# Patient Record
Sex: Male | Born: 1940 | Race: White | Hispanic: No | Marital: Married | Smoking: Former smoker
Health system: Southern US, Community
[De-identification: ages and names within clinical notes are randomized; demographics above are authoritative.]

## PROBLEM LIST (undated history)

## (undated) DIAGNOSIS — D689 Coagulation defect, unspecified: Secondary | ICD-10-CM

## (undated) DIAGNOSIS — H269 Unspecified cataract: Secondary | ICD-10-CM

## (undated) DIAGNOSIS — I639 Cerebral infarction, unspecified: Secondary | ICD-10-CM

## (undated) DIAGNOSIS — I10 Essential (primary) hypertension: Secondary | ICD-10-CM

## (undated) DIAGNOSIS — E119 Type 2 diabetes mellitus without complications: Secondary | ICD-10-CM

## (undated) HISTORY — DX: Cerebral infarction, unspecified: I63.9

## (undated) HISTORY — DX: Unspecified cataract: H26.9

## (undated) HISTORY — DX: Coagulation defect, unspecified: D68.9

## (undated) HISTORY — PX: CATARACT EXTRACTION: SUR2

## (undated) HISTORY — DX: Type 2 diabetes mellitus without complications: E11.9

## (undated) HISTORY — PX: C-EYE SURGERY PROCEDURE: 102257504

## (undated) HISTORY — DX: Essential (primary) hypertension: I10

## (undated) HISTORY — PX: EYE SURGERY: SHX253

---

## 1998-04-27 ENCOUNTER — Encounter: Payer: Self-pay | Admitting: Cardiology

## 1998-04-27 ENCOUNTER — Ambulatory Visit (HOSPITAL_COMMUNITY): Admission: RE | Admit: 1998-04-27 | Discharge: 1998-04-27 | Payer: Self-pay | Admitting: Cardiology

## 2000-10-09 ENCOUNTER — Encounter: Admission: RE | Admit: 2000-10-09 | Discharge: 2001-01-07 | Payer: Self-pay | Admitting: *Deleted

## 2000-11-07 ENCOUNTER — Encounter: Payer: Self-pay | Admitting: *Deleted

## 2000-11-07 ENCOUNTER — Encounter: Admission: RE | Admit: 2000-11-07 | Discharge: 2000-11-07 | Payer: Self-pay | Admitting: *Deleted

## 2001-02-21 ENCOUNTER — Ambulatory Visit (HOSPITAL_COMMUNITY): Admission: RE | Admit: 2001-02-21 | Discharge: 2001-02-21 | Payer: Self-pay | Admitting: Gastroenterology

## 2002-04-02 ENCOUNTER — Encounter: Payer: Self-pay | Admitting: Internal Medicine

## 2002-04-02 ENCOUNTER — Inpatient Hospital Stay (HOSPITAL_COMMUNITY): Admission: EM | Admit: 2002-04-02 | Discharge: 2002-04-05 | Payer: Self-pay | Admitting: Emergency Medicine

## 2002-05-01 ENCOUNTER — Encounter: Admission: RE | Admit: 2002-05-01 | Discharge: 2002-07-30 | Payer: Self-pay | Admitting: *Deleted

## 2003-07-02 ENCOUNTER — Ambulatory Visit (HOSPITAL_BASED_OUTPATIENT_CLINIC_OR_DEPARTMENT_OTHER): Admission: RE | Admit: 2003-07-02 | Discharge: 2003-07-02 | Payer: Self-pay | Admitting: Family Medicine

## 2006-08-10 ENCOUNTER — Emergency Department (HOSPITAL_COMMUNITY): Admission: EM | Admit: 2006-08-10 | Discharge: 2006-08-10 | Payer: Self-pay | Admitting: Emergency Medicine

## 2006-08-14 ENCOUNTER — Emergency Department (HOSPITAL_COMMUNITY): Admission: EM | Admit: 2006-08-14 | Discharge: 2006-08-14 | Payer: Self-pay | Admitting: Emergency Medicine

## 2007-11-29 ENCOUNTER — Ambulatory Visit (HOSPITAL_COMMUNITY): Admission: RE | Admit: 2007-11-29 | Discharge: 2007-11-29 | Payer: Self-pay | Admitting: Internal Medicine

## 2010-02-16 IMAGING — CR DG CHEST 2V
2 series · 2 of 2 positions shown · non-contrast
Comparison: 08/10/2006

CLINICAL DATA: Hypertension.  History of angioplasty and history of
smoking.

CHEST - 2 VIEW

[view not recorded (1 of 2)]
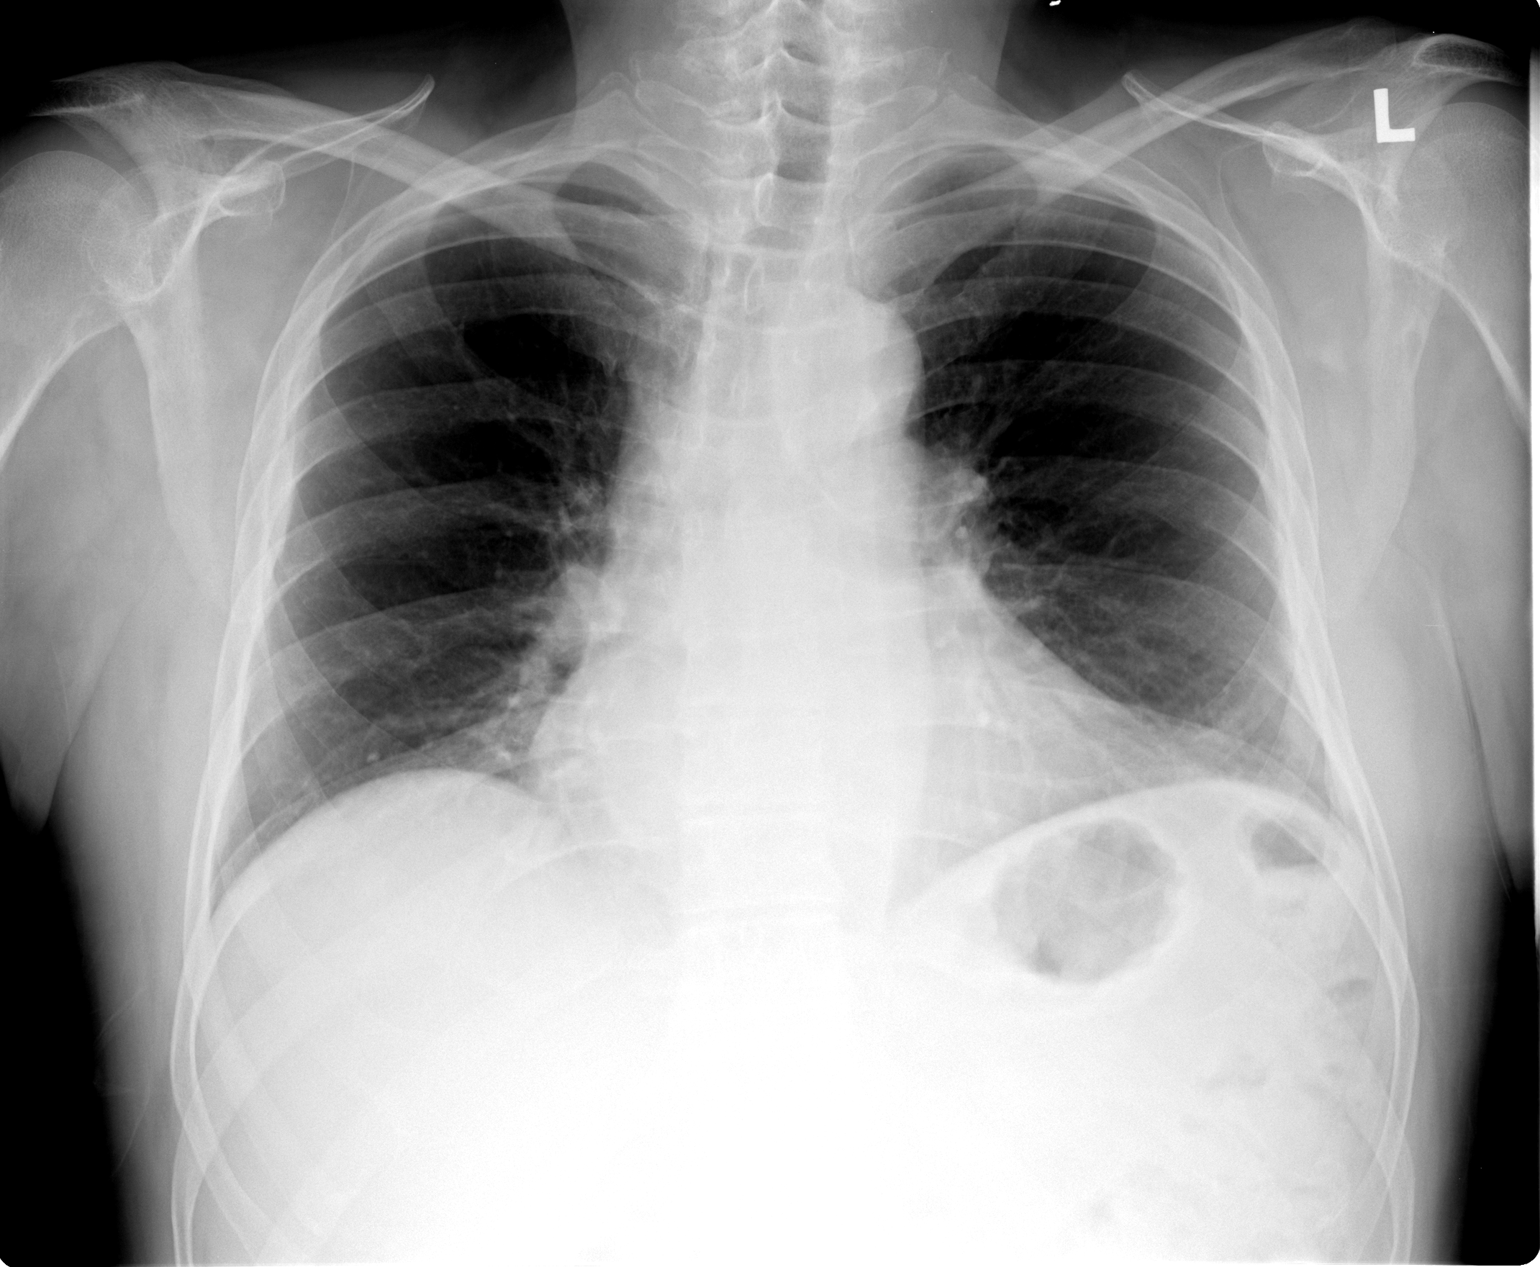

[view not recorded (2 of 2)]
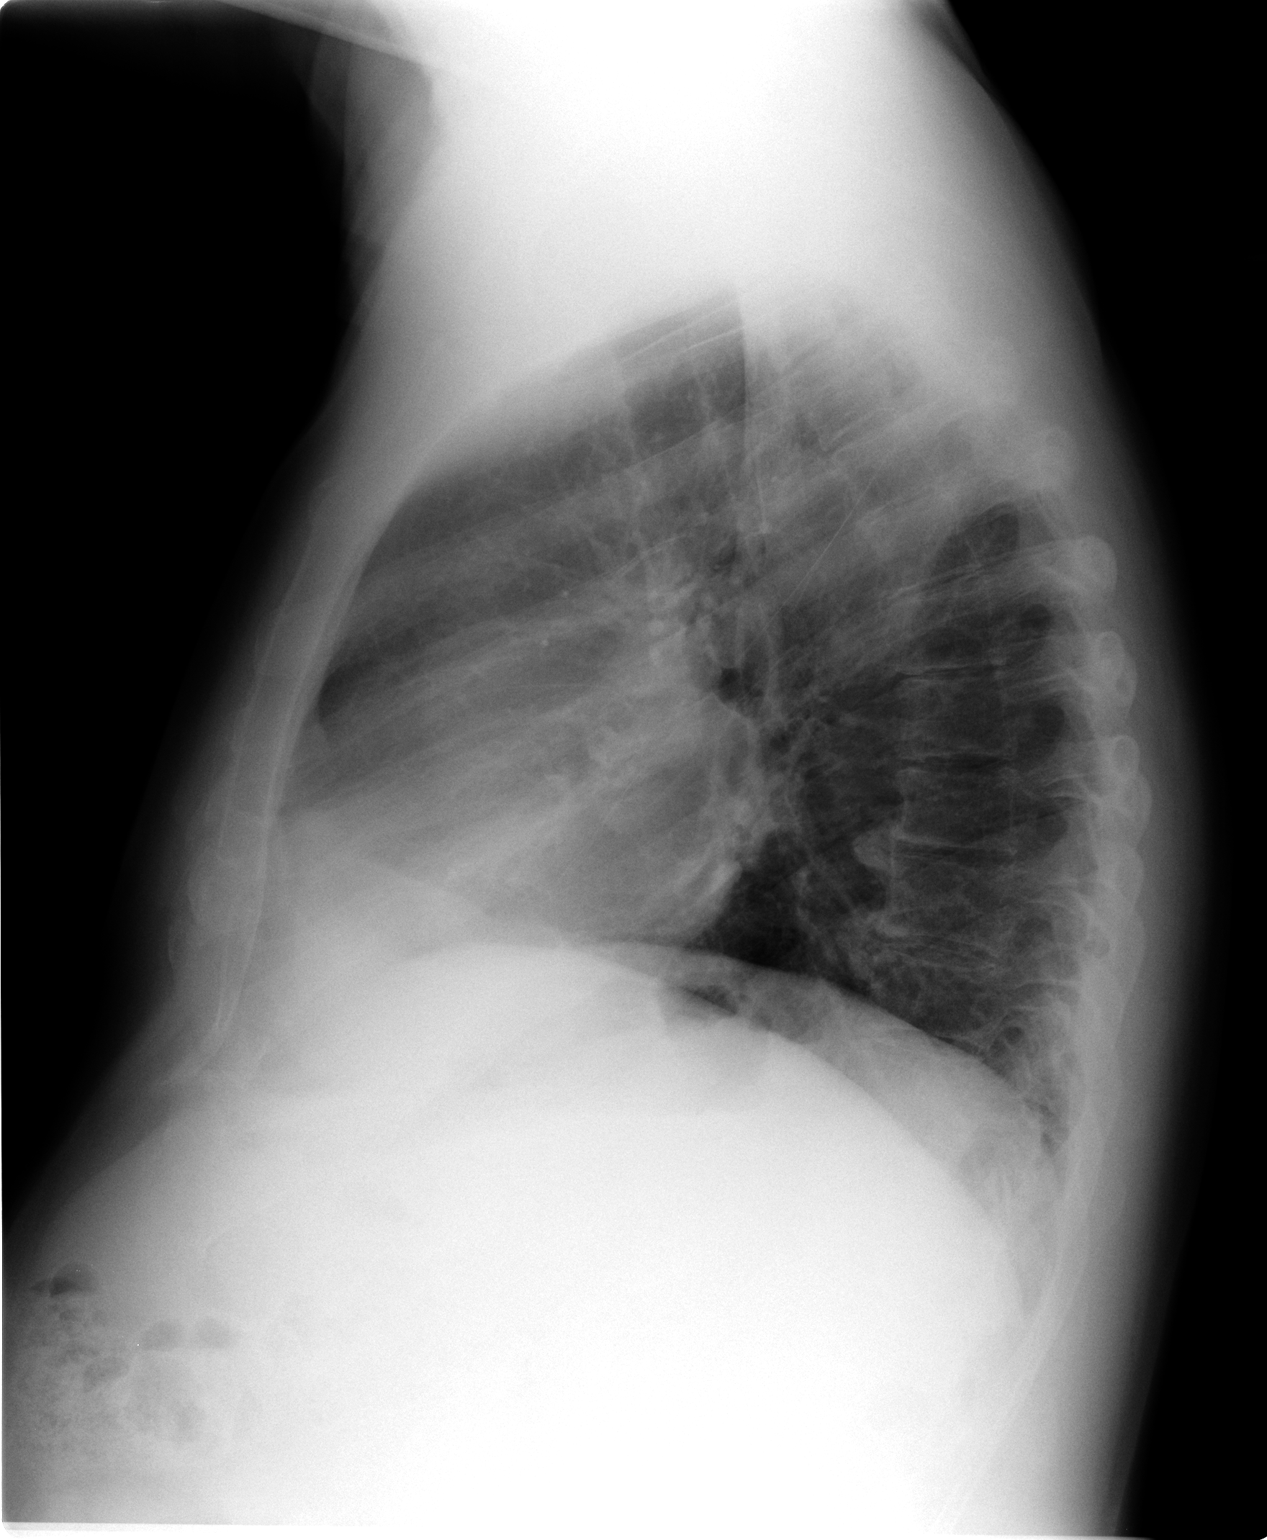

[2 of 2 positions shown; findings below may reference images not displayed]

FINDINGS: A poor inspiratory effort has been made and taking this
into consideration heart and mediastinal contours are within normal
limits.  The lung fields appear clear with the exception of some
minimal bibasilar volume loss compatible with the poor inspiration.
Bony structures demonstrate degenerative osteophytosis of the lower
lumbar spine and are otherwise intact.
IMPRESSION: Poor inspiration with no acute cardiopulmonary disease noted.

## 2010-06-04 NOTE — H&P (Signed)
Pedro Morgan, Pedro Morgan                              ACCOUNT NO.:  [id number]   MEDICAL RECORD NO.:  [id number]                   PATIENT TYPE:  EMS   LOCATION:  [facility]                                 FACILITY:  [facility]   PHYSICIAN:  Rosanne Sack, M.D.         DATE OF BIRTH:  [birth year]   DATE OF ADMISSION:  04/02/2002  DATE OF DISCHARGE:                                HISTORY & PHYSICAL   PROBLEM LIST:  1. Sign and symptom complex; fever to 101.9, right upper quadrant pain,     diarrhea, decreased level of consciousness.     a. Rule out acute cholecystitis with septicemia versus other etiologies.  2. Dehydration.  3. Uncontrolled diabetes mellitus, type 2.  4. Uncontrolled hypertension.  5. Coronary artery disease.     a. Percutaneous transluminal coronary angioplasty x2 in 1994 and 1999 by        Dr. Donnie Morgan.  6. Hyperlipidemia.  7. Hiatal hernia.  8. Internal hemorrhoids by colonoscopy on 2003.     a. Occasional rectal bleed.  9. Allergies to PENICILLIN (anaphylaxis).   CHIEF COMPLAINT:  Abdominal pain.   HISTORY OF PRESENT ILLNESS:  Pedro Morgan is a very pleasant 70 year old  gentleman sent from Pedro Morgan office this afternoon with complaints  of abdominal pain.  The patient described some diarrhea two days ago that  was fully benign.  The symptoms were pretty much resolved.  This morning the  patient woke up with a sharp-type pain in the right upper quadrant radiating  to the interscapular space.  These symptoms were intermittent.  He describes  also nausea, though no vomiting.  He also had worsening episodes of diarrhea  this morning.  There was also reported a decreased level of consciousness by  the patient's wife along with possible fever and chills.  No cough and no  chest pain.  No orthopnea.  There were occasional signs of shortness of  breath with the onset of sharp pain.  No hemoptysis or hematemesis.  No  melena, tarry stools or bright-red blood per  rectum over the last two days.  The patient had occasional rectal bleed associated with internal hemorrhoids  diagnosed by Pedro Morgan during a colonoscopy about two years ago.  No skin  rash.  No lower extremity swelling.  No focal weakness.  No syncope.  No  urinary symptoms.  No lower back pain.  No blurred vision.  No swallowing  problems.   PAST MEDICAL HISTORY:  As per problem list.   ALLERGIES:  PENICILLIN (anaphylaxis).   MEDICATIONS:  The patient was not taking consistently the following  medications:  1. Zocor 40 mg p.o. daily.  2. Atenolol 100 mg p.o. daily.  3. Lotrel 10/20 one tablet p.o. daily.  4. Maxzide 37.5/25 one tablet p.o. daily.   SOCIAL HISTORY:  The patient is married and has two children.  He is  retired.  He quit  smoking about 30 years ago.  He drank alcohol  occasionally.   FAMILY HISTORY:  The patient's brother, sisters, parents and grandparents  had hypertension.  No diabetes, stroke, coronary artery disease, or  malignancies in the family.   REVIEW OF SYSTEMS:  As per HPI.   PHYSICAL EXAMINATION:  VITAL SIGNS:  Temperature 101.9, blood pressure  180/99, heart rate 132, respirations 24, oxygen saturation 93% on room air.  HEENT:  Normocephalic, atraumatic.  Non icteric sclerae.  Conjunctivae  within normal limits.  PERRLA.  EOMI.  Funduscopic exam shows no papilledema  hemorrhages.  Tympanic membranes within normal limits.  Dry mucous  membranes.  Oropharynx is clear.  NECK:  Supple, no JVD, no bruits, adenopathy, or thyromegaly.  LUNGS:  Clear to auscultation bilaterally without crackles or wheezes, fair  air movement bilaterally.  CARDIAC:  Tachycardia, no S3, no murmurs or rubs.  No definitive gallops.  ABDOMEN:  Obese, mild tenderness, mostly in the right upper quadrant and  epigastric area.  No definitive Murphy sign.  Bowel sounds are decreased.  No hepatosplenomegaly.  No rebound or guarding.  No bruits or masses.  GU:  Within normal  limits.  RECTAL:  Normal sphincter tone, empty vault, prostate size is slightly  increased.  No stool in the vault.  EXTREMITIES:  Trace to 0 edema bilaterally around the ankles.  No cyanosis  or clubbing.  Pulses 1+ bilaterally.  NEUROLOGIC:  Slightly lethargic but arousable to verbal commands.  Strength  is 5/5 in all extremities.  DTRs 3/5 in all extremities.  Cranial nerves II-  XII intact.  Sensorium intact.  Plantar reflexes downgoing bilaterally.   LABORATORY DATA:  Pending.  EKG shows sinus tachycardia without evidence of  ST segment changes or Q waves.   ASSESSMENT AND PLAN:  1. Sign and symptom complex (as described in problem list).  The     differential diagnosis includes acute cholecystitis and cholelithiasis     with septicemia, peptic ulcer disease, rule out mild perforation, also     acute pancreatitis.  Other etiologies that seem to be less likely are     atypical angina, pulmonary embolus or dissection.  Our plan is to admit     the patient to a stepdown unit.  Check blood cultures.  CBC with     differential, CMET, CT scan of the abdomen, lipase, cardiac enzymes are     pending.  The abdominal ultrasound will be obtained and if positive for     cholecystitis, Dr. Daphine Deutscher, on call for surgery, will see the patient.  We     will start empiric Ceclor and Flagyl since the patient is allergic to     penicillin.  The patient will be NPO.  2. Dehydration.  The patient had diarrhea and fever, which seems to be the     basis for his dehydration.  The physical examination is consistent with a     moderate degree of dehydration.  Lab data pending.  We will start IV     fluids and follow the fluid balance as well as electrolytes.  3. Uncontrolled diabetes mellitus type 2.  The patient has not taken any     medications nor done any Accu-Cheks for more than two years.  The patient     was not following a diabetic diet.  His CBG is 328.  The rest of the lab    data is pending.  We  will start Lantus and sliding scale insulin  q.6h.     We will use D5 in the IV fluids as soon as the CBG start to come down.  4. Uncontrolled hypertension.  The patient's blood pressure is 180/99.     There are no signs of end organ damage.  The heart rate is 132.  For now,     we will start Cardizem drip to try to help the blood pressure and heart     rate.  5.     Coronary artery disease.  There is no history of angina.  The enzymes are     pending.  The EKG is negative.  Further therapy to protect the patient's     heart will be chosen based upon the results of the lab data that     currently is pending.                                               Rosanne Sack, M.D.    JM/MEDQ  D:  04/02/2002  T:  04/02/2002  Job:  [id number]   cc:   Christella Noa, M.D.  [address] [place]  [place], [place] [address]  Fax: [phone]   Talmadge Coventry, M.D.  [address], [address]  [place]  [place] [address]  Fax: [phone]

## 2010-06-04 NOTE — Procedures (Signed)
[facility]  Patient:    Pedro Morgan, Pedro Morgan Visit Number: [id number] MRN: [id number]          Service Type: Attending:  Fayrene Fearing L. Randa Evens, M.D. Dictated by:   Llana Aliment. Randa Evens, M.D. Proc. Date: 02/21/01   CC:         Heather Roberts, M.D.   Procedure Report  PROCEDURE:  Colonoscopy.  MEDICATIONS:  Fentanyl 100 mcg, Versed 8 mg IV.  SCOPE:  Olympus adult video colonoscope.  INDICATIONS FOR PROCEDURE:  Heme positive stool.  DESCRIPTION OF PROCEDURE:  The procedure had been explained to the patient and consent obtained. With the patient in the left lateral decubitus position, the Olympus video colonoscope was inserted and advanced under direct visualization. The prep was excellent. We were able to reach the cecum without difficulty. The scope was withdrawn and the cecum, ascending colon, hepatic flexure, transverse colon, splenic flexure, descending and sigmoid colon were seen well upon removal. No polyps were seen throughout the entire colon. The scope was withdrawn down to the rectum. The rectum was also free of polyps. The patient did have moderate internal hemorrhoids. The scope was withdrawn. The patient tolerated the procedure well and was maintained on low flow oxygen and pulse oximeter throughout the procedure.  ASSESSMENT:  Heme positive stool with the only significant finding on colonoscopy internal hemorrhoids.  PLAN:  Will give a sheet of instructions about hemorrhoids and will plan on seeing back in the office in six weeks to recheck his stool. Dictated by:   Llana Aliment. Randa Evens, M.D. Attending:  Llana Aliment. Randa Evens, M.D. DD:  02/21/01 TD:  02/21/01 Job: [id number]

## 2010-06-04 NOTE — Discharge Summary (Signed)
Pedro Morgan, Pedro Morgan                              ACCOUNT NO.:  [id number]   MEDICAL RECORD NO.:  [id number]                   PATIENT TYPE:  INP   LOCATION:  4733                                 FACILITY:  [facility]   PHYSICIAN:  Lonia Blood, M.D.            DATE OF BIRTH:  December 10, 1940   DATE OF ADMISSION:  04/02/2002  DATE OF DISCHARGE:  04/05/2002                                 DISCHARGE SUMMARY   DISCHARGE DIAGNOSIS:  1. Sign and symptom complex, unclear etiology.     a. Fever to 102.     b. Right upper quadrant abdominal pain.     c. Diarrhea.     d. Moderately decreased loss of consciousness.  2. Acute dehydration - resolved.  3. Uncontrolled diabetes mellitus type 2.  4. Uncontrolled hypertension.  5. Coronary artery disease status post Percutaneous transluminal coronary     angioplasty x 2 in 1994 and 1999.  6. Hyperlipidemia.  7. Known hiatal hernia.  8. Internal hemorrhoids by colonoscopy, February 2003.  9. ALLERGY TO PENICILLIN, LEADING TO ANAPHYLAXIS.   DISCHARGE MEDICATIONS:  The patient is discharged home with no change in his  typical outpatient medical regimen.   PROCEDURES:  1. CT scan of the abdomen and pelvis, April 03, 2002 - no acute or     significant findings in the upper abdomen.  There was inadvertent     extravasation of about 20 cc of nonionic contrast at the IV site of the     left arm.  Some sigmoid diverticulum, but no evidence of diverticulitis.  2. KUB, April 02, 2002 - normal gas bowel pattern.  No evidence of     obstruction.  3. Ultrasound of abdomen, April 02, 2002 - no acute abdominal findings     demonstrated.  Fatty infiltrate of the liver.   CONSULTATIONS:  None.   FOLLOW UP:  The patient is instructed to follow up with primary care  physician as previously arranged within 1 week of his discharge.   HISTORY OF PRESENT ILLNESS:  Mr. Pedro Morgan is a 70 year old gentleman who  presented on the day of his admission with complaints  of severe abdominal  pain.  The patient described diarrhea for 2 days prior to his admission.  Symptoms had resolved until the morning of admission.  The patient woke up  with a sharp type pain in the right upper quadrant radiating to the  intrascapular region.  Symptoms were intermittent.  He also endorsed nausea  without vomiting.  There was no melena or tarry stool or bright red blood  per rectum.  Because of significant elements on the differential to include  acute cholecystitis, cholelithiasis, peptic ulcer disease, or possible bowel  perforation, the patient was admitted to the hospital for evaluation.   HOSPITAL COURSE:  Mr. Pedro Morgan was admitted to the Acute Unit for evaluation.  Mild dehydration, felt related to  decreased p.o. intake, was corrected with  IV fluids resuscitation.  Uncontrolled diabetes was managed with a sliding  scale insulin protocol.  Uncontrolled hypertension initially required IV  Cardizem, but this was titrated to p.o. Cardizem as soon as the patient was  able to tolerate p.o. intake.  Despite the patient's history of coronary  artery disease he remained asymptomatic from this standpoint during the  hospitalization.  The patient underwent a full evaluation in regard to his  abdominal pain.  Initially a KUB was obtained and was unremarkable.  This  was followed by an ultrasound, which ruled out cholecystitis or  cholelithiasis.  Mild fatty infiltrate of the liver was appreciated.  For  more extensive evaluation a CT scan of the abdomen was obtained.  Unfortunately, there was some extravasation of the dye into the left hand  where the patient's IV was placed during this, but this resolved nicely with  local care.  By April 04, 2002, the patient's pain had improved.  He was  advanced to regular diet whereas he had been n.p.o. initially.  He tolerated  with without difficulty.  Dehydration had resolved clinically.  ___________  became better controlled.   Hypertension was slowly improving.  On April 05, 2002 the patient was doing extremely well.  He was tolerating a full regular  diet.  He is being treated empirically for the possibility of colitis.  It  is also possible that a hiatal hernia could have led to this patient's  problem.  Regardless, on April 05, 2002, he was stable.  Symptoms had  resolved.  He was afebrile.  Blood pressure and __________  were reasonably  well-controlled.   The patient was cleared for discharge home and is to follow up with his  primary care physician, as previously arranged, in approximately 5 days.                                               Lonia Blood, M.D.    JTM/MEDQ  D:  05/30/2002  T:  05/31/2002  Job:  [id number]

## 2016-11-30 ENCOUNTER — Encounter (INDEPENDENT_AMBULATORY_CARE_PROVIDER_SITE_OTHER): Payer: Self-pay | Admitting: Ophthalmology

## 2016-11-30 ENCOUNTER — Ambulatory Visit (INDEPENDENT_AMBULATORY_CARE_PROVIDER_SITE_OTHER): Payer: Medicare Other | Admitting: Ophthalmology

## 2016-11-30 DIAGNOSIS — H3581 Retinal edema: Secondary | ICD-10-CM

## 2016-11-30 DIAGNOSIS — D3131 Benign neoplasm of right choroid: Secondary | ICD-10-CM

## 2016-11-30 DIAGNOSIS — D3132 Benign neoplasm of left choroid: Secondary | ICD-10-CM | POA: Diagnosis not present

## 2016-11-30 DIAGNOSIS — E113313 Type 2 diabetes mellitus with moderate nonproliferative diabetic retinopathy with macular edema, bilateral: Secondary | ICD-10-CM

## 2016-11-30 DIAGNOSIS — H43822 Vitreomacular adhesion, left eye: Secondary | ICD-10-CM

## 2016-11-30 DIAGNOSIS — Z961 Presence of intraocular lens: Secondary | ICD-10-CM | POA: Diagnosis not present

## 2016-11-30 NOTE — Progress Notes (Signed)
[facility] Note  11/30/2016     CHIEF COMPLAINT Patient presents for Retina Evaluation and Diabetic Eye Exam   HISTORY OF PRESENT ILLNESS: Pedro Morgan is a 76 y.o. male who presents to the clinic today for:   HPI    Retina Evaluation    In both eyes.  This started 2 years ago.  Duration of 5 hours.  Associated Symptoms Floaters.  Negative for Flashes, Pain, Trauma, Fever, Redness, Scalp Tenderness, Weight Loss, Distortion, Photophobia, Jaw Claudication, Fatigue, Blind Spot, Glare and Shoulder/Hip pain.  Context:  distance vision, mid-range vision and near vision.  Treatments tried include eye drops and surgery.  Response to treatment was mild improvement.  I, the attending physician,  performed the HPI with the patient and updated documentation appropriately.          Diabetic Eye Exam    Vision is stable.  Associated Symptoms Floaters.  Negative for Distortion, Photophobia, Jaw Claudication, Fatigue, Shoulder/Hip pain, Glare, Blind Spot, Redness, Scalp Tenderness, Weight Loss, Fever, Trauma, Pain and Flashes.  Diabetes characteristics include taking oral medications, on insulin and Type 2.  This started 8 years ago.  Blood sugar level is controlled.  Last Blood Glucose 191.  Last A1C 7.3.  Associated Diagnosis Neuropathy.  I, the attending physician,  performed the HPI with the patient and updated documentation appropriately.          Comments    Referral of Pedro Morgan for eval DME OD w/hemes/ hx of VMT ou. Patient states floaters occasionally OU. He reports his eyes itches off/on. Cataract  sx ou appx 10 yrs ago.Bs 191 this am (he did not  have insulin with him yesterday so BS ran a little high this am) It normally runs low 100's per patient . Last A1C 7.3  three  months ago . Uses systane  Eye gtts PRN . Denies eye vits       Last edited by Pedro Caffey, MD on 12/01/2016  6:13 AM. (History)    Pt states that he feels "images are not plain", pt  state that his vision is not clear; Pt reports that he is unable to "make out features"; Pt states he does not take medication for RA; Pt states that he had cataract sx by Pedro Morgan, pt denies having any retinal sx;   Referring physician: Hortencia Pilar, MD [place], [place] [address]  HISTORICAL INFORMATION:   Selected notes from the MEDICAL RECORD NUMBER Referred by Dr. Read Morgan for concern of VMT OU;  LEE- 11.13.18 (Dr. Read Morgan) [OD: 20/50 OS: 20/40] Ocular Hx- glaucoma suspect OU, pseudophakia OU, VMT OU, macular drusen OD  PMH- DM type II, HTN, high chol., asthma, RA, hx of stroke, former smoker   CURRENT MEDICATIONS: No current outpatient medications on file. (Ophthalmic Drugs)   No current facility-administered medications for this visit.  (Ophthalmic Drugs)   Current Outpatient Medications (Other)  Medication Sig  . ACCU-CHEK AVIVA PLUS test strip   . atorvastatin (LIPITOR) 80 MG tablet   . baclofen (LIORESAL) 10 MG tablet   . clopidogrel (PLAVIX) 75 MG tablet   . enalapril (VASOTEC) 20 MG tablet   . HYDROcodone-acetaminophen (NORCO/VICODIN) 5-325 MG tablet   . isosorbide mononitrate (IMDUR) 120 MG 24 hr tablet   . metFORMIN (GLUCOPHAGE-XR) 500 MG 24 hr tablet   . metoprolol tartrate (LOPRESSOR) 50 MG tablet   . pantoprazole (PROTONIX) 40 MG tablet   . triamterene-hydrochlorothiazide (  MAXZIDE-25) 37.5-25 MG tablet    No current facility-administered medications for this visit.  (Other)      REVIEW OF SYSTEMS: ROS    Positive for: Neurological, Skin, Musculoskeletal, Endocrine, Cardiovascular, Eyes, Allergic/Imm, Heme/Lymph   Negative for: Constitutional, Gastrointestinal, Genitourinary, HENT, Respiratory, Psychiatric   Last edited by Pedro Jordan, LPN on 96/04/5407  8:11 PM. (History)       ALLERGIES Allergies  Allergen Reactions  . Penicillins Anaphylaxis    PAST MEDICAL HISTORY Past Medical History:  Diagnosis Date  .  Cataract   . Clotting disorder ([place])   . Diabetes mellitus without complication ([place])   . Hypertension   . Stroke [facility])    2008/1998   Past Surgical History:  Procedure Laterality Date  . C-EYE SURGERY PROCEDURE    . CATARACT EXTRACTION    . EYE SURGERY      FAMILY HISTORY Family History  Problem Relation Age of Onset  . Diabetes Brother     SOCIAL HISTORY Social History   Tobacco Use  . Smoking status: Former Smoker    Last attempt to quit: 1980    Years since quitting: 38.8  . Smokeless tobacco: Former Network engineer Use Topics  . Alcohol use: Yes    Alcohol/week: 0.6 oz    Types: 1 Cans of beer per week    Frequency: Never  . Drug use: No         OPHTHALMIC EXAM:  Base Eye Exam    Visual Acuity (Snellen - Linear)      Right Left   Dist cc 20/30 -2 20/25 -1   Correction:  Glasses  Work up done by [employer] (Tonopen, 3:09 PM)      Right Left   Pressure 10 15       Pupils      Dark Shape APD   Right 3 Round None   Left 3 Round None       Visual Fields (Counting fingers)      Left Right    Full Full       Extraocular Movement      Right Left    Full, Ortho Full, Ortho       Neuro/Psych    Oriented x3:  Yes   Mood/Affect:  Normal       Dilation    Both eyes:  1.0% Mydriacyl, 2.5% Phenylephrine @ 3:09 PM        Slit Lamp and Fundus Exam    Slit Lamp Exam      Right Left   Lids/Lashes Dermatochalasis - upper lid Dermatochalasis - upper lid   Conjunctiva/Sclera White and quiet White and quiet   Cornea Arcus Arcus   Anterior Chamber Deep and quiet Deep and quiet   Iris Round and dilated to 72mm, No NVI Round and dilated to 45mm, No NVI   Lens Posterior chamber intraocular lens in good position Posterior chamber intraocular lens in good position, open PC   Vitreous Vitreous syneresis Vitreous syneresis       Fundus Exam      Right Left   Disc Normal, No NVD Normal, No NVD   C/D Ratio 0.45 0.5   Macula Microaneurysms,  Retinal pigment epithelial mottling, blunted foveal reflex Retinal pigment epithelial mottling, abnormal foveal reflex, few temporal MAs   Vessels Tortuous in macula Normal   Periphery Small flat Choroidal nevus at 0430 1DD from disc, attached, rare MAs, mild Reticular degeneration Attached,  2DD choroidal nevus at 130--relatively flat w/ mild overlying drusen/RPE changes, no SRF or org pigment; mild Reticular degeneration        Refraction    Wearing Rx      Sphere Cylinder Axis Add   Right -0.75 +1.00 005 +2.00   Left -1.75 +0.50 170 +2.00       Manifest Refraction      Sphere Cylinder Axis Dist VA   Right -1.00 +1.50 005 20/30   Left -2.00 +1.50 180 20/25-1          IMAGING AND PROCEDURES  Imaging and Procedures for 12/01/16  OCT, Retina - OU - Both Eyes     Right Eye Central Foveal Thickness: 329. Progression has no prior data. Findings include abnormal foveal contour, intraretinal fluid, retinal drusen , pigment epithelial detachment, no SRF (Central CME, single small PED superior fovea, PVD).   Left Eye Central Foveal Thickness: 283. Progression has no prior data. Findings include abnormal foveal contour, no SRF, intraretinal fluid, vitreomacular adhesion  (VMT).   Notes Images taken, stored on drive  Diagnosis / Impression:  OD: CME OS: VMT  Clinical management:  See below  Abbreviations: NFP - Normal foveal profile. CME - cystoid macular edema. PED - pigment epithelial detachment. IRF - intraretinal fluid. SRF - subretinal fluid. EZ - ellipsoid zone. ERM - epiretinal membrane. ORA - outer retinal atrophy. ORT - outer retinal tubulation. SRHM - subretinal hyper-reflective material         Fluorescein Angiography Optos (Transit OD)     Right Eye Progression has no prior data. Early phase findings include microaneurysm. Mid/Late phase findings include leakage, microaneurysm.   Left Eye Progression has no prior data. Early phase findings include microaneurysm.  Mid/Late phase findings include microaneurysm, leakage.   Notes OD: scattered microaneurysms with late leakage -- sup temp fovea affected OS: scattered microaneurysms with late leakage -- mostly extramacular                ASSESSMENT/PLAN:    ICD-10-CM   1. 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 Retinal edema H35.81 OCT, Retina - OU - Both Eyes    Fluorescein Angiography Optos (Transit OD)  5. Pseudophakia of both eyes Z96.1     1. Moderate Non-proliferative diabetic retinopathy, both eyes - The incidence, risk factors for progression, natural history and treatment options for diabetic retinopathy  were discussed with patient.   - The need for close monitoring of blood glucose, blood pressure, and serum lipids, avoiding cigarette or any type of tobacco, and the need for long term follow up was also discussed with patient. - exam shows scattered MAs, IRH; no NV - FA with scattered MAs with late leakage; no occult NV - OCT shows diabetic macular edema, right eye  The natural history, pathology, and characteristics of diabetic macular edema discussed with patient.  A generalized discussion of the major clinical trials concerning treatment of diabetic macular edema (ETDRS, DCT, SCORE, RISE / RIDE, and ongoing DRCR net studies) was completed.  This discussion included mention of the various approaches to treating diabetic macular edema (observation, laser photocoagulation, anti-VEGF injections with lucentis / Avastin / Eylea, steroid injections with Kenalog / Ozurdex, and intraocular surgery with vitrectomy).  The goal hemoglobin A1C of 6-7 was discussed, as  well as importance of smoking cessation and hypertension control.  Need for ongoing treatment and monitoring were specifically discussed  with reference to chronic nature of diabetic macular edema. - mild DME OD with good VA at 20/30  - pt wishes to observe for now -- reasonable - recommend close f/u - f/u in 4 wks  2. Vitreomacular traction (VMT), OS - mild traction with cystic changes at fovea - asymptomatic - discussed findings and prognosis - will monitor for now  3. Choroidal nevus OU - OD with small flat nevus inf nasal to disc - OS w/ 2DD nevus sup temp quadrant midzone w/ drusen - no SRF orange pigment over either lesion - monitor  4. Retinal edema OD as above  5. Pseudophakia OU-  - s/p CE/IOL OU - beautiful surgery, doing well - monitor   Ophthalmic Meds Ordered this visit:  No orders of the defined types were placed in this encounter.      Return in about 4 weeks (around 12/28/2016) for Dilated Exam.  There are no Patient Instructions on file for this visit.   Explained the diagnoses, plan, and follow up with the patient and they expressed understanding.  Patient expressed understanding of the importance of proper follow up care.   Gardiner Sleeper, M.D., Ph.D. Diseases & Surgery of the Retina and Vitreous [facility] 12/01/16     Abbreviations: M myopia (nearsighted); A astigmatism; H hyperopia (farsighted); P presbyopia; Mrx spectacle prescription;  CTL contact lenses; OD right eye; OS left eye; OU both eyes  XT exotropia; ET esotropia; PEK punctate epithelial keratitis; PEE punctate epithelial erosions; DES dry eye syndrome; MGD meibomian gland dysfunction; ATs artificial tears; PFAT's preservative free artificial tears; [place] nuclear sclerotic cataract; PSC posterior subcapsular cataract; ERM epi-retinal membrane; PVD posterior vitreous detachment; RD retinal detachment; DM diabetes mellitus; DR diabetic retinopathy; NPDR non-proliferative diabetic retinopathy; PDR proliferative diabetic retinopathy; CSME clinically significant macular edema; DME diabetic macular edema;  dbh dot blot hemorrhages; CWS cotton wool spot; POAG primary open angle glaucoma; C/D cup-to-disc ratio; HVF humphrey visual field; GVF goldmann visual field; OCT optical coherence tomography; IOP intraocular pressure; BRVO Branch retinal vein occlusion; CRVO central retinal vein occlusion; CRAO central retinal artery occlusion; BRAO branch retinal artery occlusion; RT retinal tear; SB scleral buckle; PPV pars plana vitrectomy; VH Vitreous hemorrhage; PRP panretinal laser photocoagulation; IVK intravitreal kenalog; VMT vitreomacular traction; MH Macular hole;  NVD neovascularization of the disc; NVE neovascularization elsewhere; AREDS age related eye disease study; ARMD age related macular degeneration; POAG primary open angle glaucoma; EBMD epithelial/anterior basement membrane dystrophy; ACIOL anterior chamber intraocular lens; IOL intraocular lens; PCIOL posterior chamber intraocular lens; Phaco/IOL phacoemulsification with intraocular lens placement; [place] photorefractive keratectomy; LASIK laser assisted in situ keratomileusis; HTN hypertension; DM diabetes mellitus; COPD chronic obstructive pulmonary disease

## 2016-12-28 ENCOUNTER — Encounter (INDEPENDENT_AMBULATORY_CARE_PROVIDER_SITE_OTHER): Payer: Self-pay | Admitting: Ophthalmology

## 2016-12-28 ENCOUNTER — Ambulatory Visit (INDEPENDENT_AMBULATORY_CARE_PROVIDER_SITE_OTHER): Payer: Medicare Other | Admitting: Ophthalmology

## 2016-12-28 DIAGNOSIS — D3132 Benign neoplasm of left choroid: Secondary | ICD-10-CM | POA: Diagnosis not present

## 2016-12-28 DIAGNOSIS — Z961 Presence of intraocular lens: Secondary | ICD-10-CM

## 2016-12-28 DIAGNOSIS — D3131 Benign neoplasm of right choroid: Secondary | ICD-10-CM

## 2016-12-28 DIAGNOSIS — E113313 Type 2 diabetes mellitus with moderate nonproliferative diabetic retinopathy with macular edema, bilateral: Secondary | ICD-10-CM | POA: Diagnosis not present

## 2016-12-28 DIAGNOSIS — H43822 Vitreomacular adhesion, left eye: Secondary | ICD-10-CM

## 2016-12-28 DIAGNOSIS — H3581 Retinal edema: Secondary | ICD-10-CM | POA: Diagnosis not present

## 2016-12-28 NOTE — Progress Notes (Signed)
[facility]  12/28/2016     CHIEF COMPLAINT Patient presents for Retina Follow Up   HISTORY OF PRESENT ILLNESS: Pedro Morgan is a 76 y.o. male who presents to the clinic today for:   HPI    Retina Follow Up    Patient presents with  Diabetic Retinopathy.  In both eyes.  This started years ago.  Severity is moderate.  Duration of 4 weeks.  Since onset it is stable.  I, the attending physician,  performed the HPI with the patient and updated documentation appropriately.          Comments    Pt presents for 4 week follow up of NPDR OU; Pt states vision is stable OU, reports seeing halos around objects, pt denies flashes, floaters and ocular pain; Pt reports last CBG was 110 this morning and is stable;       Last edited by Debbrah Alar, COT on 12/28/2016  1:11 PM. (History)    Pt states that he feels "images are not plain", pt state that his vision is not clear; Pt reports that he is unable to "make out features"; Pt states he does not take medication for RA; Pt states that he had cataract sx by Dr. Herbert Deaner, pt denies having any retinal sx;   Referring physician: No referring provider defined for this encounter.  HISTORICAL INFORMATION:   Selected notes from the MEDICAL RECORD NUMBER Referred by Dr. Read Drivers for concern of VMT OU;  LEE- 11.13.18 (Dr. Read Drivers) [OD: 20/50 OS: 20/40] Ocular Hx- glaucoma suspect OU, pseudophakia OU, VMT OU, macular drusen OD  PMH- DM type II, HTN, high chol., asthma, RA, hx of stroke, former smoker   CURRENT MEDICATIONS: No current outpatient medications on file. (Ophthalmic Drugs)   No current facility-administered medications for this visit.  (Ophthalmic Drugs)   Current Outpatient Medications (Other)  Medication Sig  . ACCU-CHEK AVIVA PLUS test strip   . atorvastatin (LIPITOR) 80 MG tablet   . baclofen (LIORESAL) 10 MG tablet   . clopidogrel (PLAVIX) 75 MG tablet   . enalapril (VASOTEC) 20 MG tablet    . HYDROcodone-acetaminophen (NORCO/VICODIN) 5-325 MG tablet   . isosorbide mononitrate (IMDUR) 120 MG 24 hr tablet   . metFORMIN (GLUCOPHAGE-XR) 500 MG 24 hr tablet   . metoprolol tartrate (LOPRESSOR) 50 MG tablet   . pantoprazole (PROTONIX) 40 MG tablet   . triamterene-hydrochlorothiazide (MAXZIDE-25) 37.5-25 MG tablet    No current facility-administered medications for this visit.  (Other)      REVIEW OF SYSTEMS: ROS    Positive for: Eyes   Negative for: Constitutional, Gastrointestinal, Neurological, Skin, Genitourinary, Musculoskeletal, HENT, Endocrine, Cardiovascular, Respiratory, Psychiatric, Allergic/Imm, Heme/Lymph   Last edited by Debbrah Alar, COT on 12/28/2016 12:58 PM. (History)       ALLERGIES Allergies  Allergen Reactions  . Penicillins Anaphylaxis    PAST MEDICAL HISTORY Past Medical History:  Diagnosis Date  . Cataract   . Clotting disorder ([place])   . Diabetes mellitus without complication ([place])   . Hypertension   . Stroke [facility])    2008/1998   Past Surgical History:  Procedure Laterality Date  . C-EYE SURGERY PROCEDURE    . CATARACT EXTRACTION    . EYE SURGERY      FAMILY HISTORY Family History  Problem Relation Age of Onset  . Diabetes Brother     SOCIAL HISTORY Social History   Tobacco Use  . Smoking status:  Former Smoker    Last attempt to quit: 1980    Years since quitting: 38.9  . Smokeless tobacco: Former Network engineer Use Topics  . Alcohol use: Yes    Alcohol/week: 0.6 oz    Types: 1 Cans of beer per week    Frequency: Never  . Drug use: No         OPHTHALMIC EXAM:  Base Eye Exam    Visual Acuity (Snellen - Linear)      Right Left   Dist cc 20/40 20/30 +2   Dist ph cc 20/40 20/30   Correction:  Glasses       Tonometry (Tonopen, 1:08 PM)      Right Left   Pressure 14 13       Pupils      Dark Light Shape React APD   Right 4 2 Round Brisk None   Left 4 2 Round Brisk None       Visual Fields (Counting  fingers)      Left Right    Full Full       Extraocular Movement      Right Left    Full, Nystagmus Full, Nystagmus       Neuro/Psych    Oriented x3:  Yes   Mood/Affect:  Normal       Dilation    Both eyes:  1.0% Mydriacyl, 2.5% Phenylephrine @ 1:08 PM        Slit Lamp and Fundus Exam    Slit Lamp Exam      Right Left   Lids/Lashes Dermatochalasis - upper lid Dermatochalasis - upper lid   Conjunctiva/Sclera White and quiet White and quiet   Cornea Arcus Arcus   Anterior Chamber Deep and quiet Deep and quiet   Iris Round and dilated to 53mm, No NVI Round and dilated to 88mm, No NVI   Lens Posterior chamber intraocular lens in good position Posterior chamber intraocular lens in good position, open PC   Vitreous Vitreous syneresis Vitreous syneresis       Fundus Exam      Right Left   Disc Normal, No NVD Normal, No NVD   C/D Ratio 0.45 0.5   Macula Microaneurysms, Retinal pigment epithelial mottling, blunted foveal reflex Retinal pigment epithelial mottling, abnormal foveal reflex, few temporal MAs   Vessels Tortuous in macula AV crossing changes, Copper wiring   Periphery Small flat Choroidal nevus at 0430 1DD from disc, attached, rare MAs, mild Reticular degeneration Attached, 2DD choroidal nevus at 130--relatively flat w/ mild overlying drusen/RPE changes, no SRF or org pigment; mild Reticular degeneration          IMAGING AND PROCEDURES  Imaging and Procedures for 12/28/16  OCT, Retina - OU - Both Eyes     Right Eye Central Foveal Thickness: 315. Progression has improved. Findings include abnormal foveal contour, intraretinal fluid, retinal drusen , pigment epithelial detachment, no SRF (Central CME, single small PED superior fovea, PVD).   Left Eye Central Foveal Thickness: 281. Progression has been stable. Findings include abnormal foveal contour, no SRF, intraretinal fluid, vitreomacular adhesion , vitreous traction.   Notes Images taken, stored on  drive  Diagnosis / Impression:  OD: CME-interval improvement OS: VMT-stable, minimal interval change   Clinical management:  See below  Abbreviations: NFP - Normal foveal profile. CME - cystoid macular edema. PED - pigment epithelial detachment. IRF - intraretinal fluid. SRF - subretinal fluid. EZ - ellipsoid zone. ERM - epiretinal membrane. ORA -  outer retinal atrophy. ORT - outer retinal tubulation. SRHM - subretinal hyper-reflective material                  ASSESSMENT/PLAN:    ICD-10-CM   1. 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 Retinal edema H35.81   5. Pseudophakia of both eyes Z96.1     1. Moderate Non-proliferative diabetic retinopathy, both eyes - The incidence, risk factors for progression, natural history and treatment options for diabetic retinopathy  were discussed with patient.   - The need for close monitoring of blood glucose, blood pressure, and serum lipids, avoiding cigarette or any type of tobacco, and the need for long term follow up was also discussed with patient. - exam shows scattered MAs, IRH; no NV - FA in 11/2016 with scattered MAs with late leakage; no occult NV - OCT shows mild diabetic macular edema, right eye -- stable to improved today The natural history, pathology, and characteristics of diabetic macular edema discussed with patient.  A generalized discussion of the major clinical trials concerning treatment of diabetic macular edema (ETDRS, DCT, SCORE, RISE / RIDE, and ongoing DRCR net studies) was completed.  This discussion included mention of the various approaches to treating diabetic macular edema (observation, laser photocoagulation, anti-VEGF injections with lucentis / Avastin / Eylea, steroid injections with  Kenalog / Ozurdex, and intraocular surgery with vitrectomy).  The goal hemoglobin A1C of 6-7 was discussed, as well as importance of smoking cessation and hypertension control.  Need for ongoing treatment and monitoring were specifically discussed with reference to chronic nature of diabetic macular edema. - mild DME OD stable to improved with VA at 20/40  - pt wishes to observe for now -- reasonable - recommend close f/u - f/u in 6 wks  2. Vitreomacular traction (VMT), OS - mild traction with cystic changes at fovea -- stable - asymptomatic - discussed findings and prognosis - will monitor for now  3. Choroidal nevus OU - OD with small flat nevus inf nasal to disc - OS w/ 2DD nevus sup temp quadrant midzone w/ drusen - no SRF orange pigment over either lesion - monitor  4. Retinal edema OD as above  5. Pseudophakia OU-  - s/p CE/IOL OU - beautiful surgery, doing well - monitor   Ophthalmic Meds Ordered this visit:  No orders of the defined types were placed in this encounter.      Return in about 6 weeks (around 02/08/2017) for Dilated Exam, OCT.  There are no Patient Instructions on file for this visit.   Explained the diagnoses, plan, and follow up with the patient and they expressed understanding.  Patient expressed understanding of the importance of proper follow up care.   This document serves as a record of services personally performed by Gardiner Sleeper, MD, PhD. It was created on their behalf by Catha Brow, [place], a certified ophthalmic assistant. The creation of this record is the provider's dictation and/or activities during the visit.  Electronically signed by: Catha Brow, COA  12/28/16 2:57 PM    Gardiner Sleeper, M.D., Ph.D. Diseases & Surgery of the Retina and Vitreous [facility] 12/28/16  I have reviewed the above documentation for accuracy and completeness, and I  agree with the above. Gardiner Sleeper, M.D., Ph.D.  12/28/16 2:57 PM     Abbreviations: M myopia (nearsighted); A astigmatism; H hyperopia (farsighted); P presbyopia; Mrx spectacle prescription;  CTL contact lenses; OD right eye; OS left eye; OU both eyes  XT exotropia; ET esotropia; PEK punctate epithelial keratitis; PEE punctate epithelial erosions; DES dry eye syndrome; MGD meibomian gland dysfunction; ATs artificial tears; PFAT's preservative free artificial tears; [place] nuclear sclerotic cataract; PSC posterior subcapsular cataract; ERM epi-retinal membrane; PVD posterior vitreous detachment; RD retinal detachment; DM diabetes mellitus; DR diabetic retinopathy; NPDR non-proliferative diabetic retinopathy; PDR proliferative diabetic retinopathy; CSME clinically significant macular edema; DME diabetic macular edema; dbh dot blot hemorrhages; CWS cotton wool spot; POAG primary open angle glaucoma; C/D cup-to-disc ratio; HVF humphrey visual field; GVF goldmann visual field; OCT optical coherence tomography; IOP intraocular pressure; BRVO Branch retinal vein occlusion; CRVO central retinal vein occlusion; CRAO central retinal artery occlusion; BRAO branch retinal artery occlusion; RT retinal tear; SB scleral buckle; PPV pars plana vitrectomy; VH Vitreous hemorrhage; PRP panretinal laser photocoagulation; IVK intravitreal kenalog; VMT vitreomacular traction; MH Macular hole;  NVD neovascularization of the disc; NVE neovascularization elsewhere; AREDS age related eye disease study; ARMD age related macular degeneration; POAG primary open angle glaucoma; EBMD epithelial/anterior basement membrane dystrophy; ACIOL anterior chamber intraocular lens; IOL intraocular lens; PCIOL posterior chamber intraocular lens; Phaco/IOL phacoemulsification with intraocular lens placement; [place] photorefractive keratectomy; LASIK laser assisted in situ keratomileusis; HTN hypertension; DM diabetes mellitus; COPD chronic obstructive pulmonary disease

## 2017-01-23 ENCOUNTER — Other Ambulatory Visit: Payer: Self-pay | Admitting: Internal Medicine

## 2017-02-07 NOTE — Progress Notes (Deleted)
[facility] Note  02/08/2017     CHIEF COMPLAINT Patient presents for No chief complaint on file.   HISTORY OF PRESENT ILLNESS: Pedro Morgan is a 77 y.o. male who presents to the clinic today for:   Pt states that he feels "images are not plain", pt state that his vision is not clear; Pt reports that he is unable to "make out features"; Pt states he does not take medication for RA; Pt states that he had cataract sx by Dr. Herbert Deaner, pt denies having any retinal sx;   Referring physician: No referring provider defined for this encounter.  HISTORICAL INFORMATION:   Selected notes from the MEDICAL RECORD NUMBER Referred by Dr. Read Drivers for concern of VMT OU;  LEE- 11.13.18 (Dr. Read Drivers) [OD: 20/50 OS: 20/40] Ocular Hx- glaucoma suspect OU, pseudophakia OU, VMT OU, macular drusen OD  PMH- DM type II, HTN, high chol., asthma, RA, hx of stroke, former smoker   CURRENT MEDICATIONS: No current outpatient medications on file. (Ophthalmic Drugs)   No current facility-administered medications for this visit.  (Ophthalmic Drugs)   Current Outpatient Medications (Other)  Medication Sig   ACCU-CHEK AVIVA PLUS test strip    atorvastatin (LIPITOR) 80 MG tablet    baclofen (LIORESAL) 10 MG tablet    clopidogrel (PLAVIX) 75 MG tablet    enalapril (VASOTEC) 20 MG tablet    HYDROcodone-acetaminophen (NORCO/VICODIN) 5-325 MG tablet    isosorbide mononitrate (IMDUR) 120 MG 24 hr tablet    metFORMIN (GLUCOPHAGE-XR) 500 MG 24 hr tablet    metoprolol tartrate (LOPRESSOR) 50 MG tablet    pantoprazole (PROTONIX) 40 MG tablet    triamterene-hydrochlorothiazide (MAXZIDE-25) 37.5-25 MG tablet    No current facility-administered medications for this visit.  (Other)      REVIEW OF SYSTEMS:    ALLERGIES Allergies  Allergen Reactions   Penicillins Anaphylaxis    PAST MEDICAL HISTORY Past Medical History:  Diagnosis Date   Cataract    Clotting  disorder ([place])    Diabetes mellitus without complication ([place])    Hypertension    Stroke ([place])    2008/1998   Past Surgical History:  Procedure Laterality Date   C-EYE SURGERY PROCEDURE     CATARACT EXTRACTION     EYE SURGERY      FAMILY HISTORY Family History  Problem Relation Age of Onset   Diabetes Brother     SOCIAL HISTORY Social History   Tobacco Use   Smoking status: Former Smoker    Last attempt to quit: 1980    Years since quitting: 39.0   Smokeless tobacco: Former Systems developer  Substance Use Topics   Alcohol use: Yes    Alcohol/week: 0.6 oz    Types: 1 Cans of beer per week    Frequency: Never   Drug use: No         OPHTHALMIC EXAM:   Not recorded      IMAGING AND PROCEDURES  Imaging and Procedures for 02/07/17           ASSESSMENT/PLAN:    ICD-10-CM   1. 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 Retinal edema H35.81   5. Pseudophakia of both eyes Z96.1     1.  Moderate Non-proliferative diabetic retinopathy, both eyes - The incidence, risk factors for progression, natural history and treatment options for diabetic retinopathy  were discussed with patient.   - The need for close monitoring of blood glucose, blood pressure, and serum lipids, avoiding cigarette or any type of tobacco, and the need for long term follow up was also discussed with patient. - exam shows scattered MAs, IRH; no NV - FA in 11/2016 with scattered MAs with late leakage; no occult NV - OCT shows mild diabetic macular edema, right eye -- stable to improved today The natural history, pathology, and characteristics of diabetic macular edema discussed with patient.  A generalized discussion of the major clinical trials concerning treatment of  diabetic macular edema (ETDRS, DCT, SCORE, RISE / RIDE, and ongoing DRCR net studies) was completed.  This discussion included mention of the various approaches to treating diabetic macular edema (observation, laser photocoagulation, anti-VEGF injections with lucentis / Avastin / Eylea, steroid injections with Kenalog / Ozurdex, and intraocular surgery with vitrectomy).  The goal hemoglobin A1C of 6-7 was discussed, as well as importance of smoking cessation and hypertension control.  Need for ongoing treatment and monitoring were specifically discussed with reference to chronic nature of diabetic macular edema. - mild DME OD stable to improved with VA at 20/40  - pt wishes to observe for now -- reasonable - recommend close f/u - f/u in 6 wks  2. Vitreomacular traction (VMT), OS - mild traction with cystic changes at fovea -- stable - asymptomatic - discussed findings and prognosis - will monitor for now  3. Choroidal nevus OU - OD with small flat nevus inf nasal to disc - OS w/ 2DD nevus sup temp quadrant midzone w/ drusen - no SRF orange pigment over either lesion - monitor  4. Retinal edema OD as above  5. Pseudophakia OU-  - s/p CE/IOL OU - beautiful surgery, doing well - monitor   Ophthalmic Meds Ordered this visit:  No orders of the defined types were placed in this encounter.      No Follow-up on file.  There are no Patient Instructions on file for this visit.   Explained the diagnoses, plan, and follow up with the patient and they expressed understanding.  Patient expressed understanding of the importance of proper follow up care.   This document serves as a record of services personally performed by Gardiner Sleeper, MD, PhD. It was created on their behalf by Catha Brow, [place], a certified ophthalmic assistant. The creation of this record is the provider's dictation and/or activities during the visit.  Electronically signed by: Catha Brow, COA  02/07/17 8:36  AM    Gardiner Sleeper, M.D., Ph.D. Diseases & Surgery of the Retina and Vitreous [facility] 02/07/17     Abbreviations: M myopia (nearsighted); A astigmatism; H hyperopia (farsighted); P presbyopia; Mrx spectacle prescription;  CTL contact lenses; OD right eye; OS left eye; OU both eyes  XT exotropia; ET esotropia; PEK punctate epithelial keratitis; PEE punctate epithelial erosions; DES dry eye syndrome; MGD meibomian gland dysfunction; ATs artificial tears; PFAT's preservative free artificial tears; [place] nuclear sclerotic cataract; PSC posterior subcapsular cataract; ERM epi-retinal membrane; PVD posterior vitreous detachment; RD retinal detachment; DM diabetes mellitus; DR diabetic retinopathy; NPDR non-proliferative diabetic retinopathy; PDR proliferative diabetic retinopathy; CSME clinically significant macular edema; DME diabetic macular edema; dbh dot blot hemorrhages; CWS cotton wool spot; POAG primary open angle glaucoma; C/D cup-to-disc ratio; HVF humphrey visual field; GVF  goldmann visual field; OCT optical coherence tomography; IOP intraocular pressure; BRVO Branch retinal vein occlusion; CRVO central retinal vein occlusion; CRAO central retinal artery occlusion; BRAO branch retinal artery occlusion; RT retinal tear; SB scleral buckle; PPV pars plana vitrectomy; VH Vitreous hemorrhage; PRP panretinal laser photocoagulation; IVK intravitreal kenalog; VMT vitreomacular traction; MH Macular hole;  NVD neovascularization of the disc; NVE neovascularization elsewhere; AREDS age related eye disease study; ARMD age related macular degeneration; POAG primary open angle glaucoma; EBMD epithelial/anterior basement membrane dystrophy; ACIOL anterior chamber intraocular lens; IOL intraocular lens; PCIOL posterior chamber intraocular lens; Phaco/IOL phacoemulsification with intraocular lens placement; [place] photorefractive keratectomy; LASIK laser assisted in situ keratomileusis; HTN  hypertension; DM diabetes mellitus; COPD chronic obstructive pulmonary disease

## 2017-02-08 ENCOUNTER — Encounter (INDEPENDENT_AMBULATORY_CARE_PROVIDER_SITE_OTHER): Payer: Medicare Other | Admitting: Ophthalmology

## 2019-05-15 ENCOUNTER — Ambulatory Visit (INDEPENDENT_AMBULATORY_CARE_PROVIDER_SITE_OTHER): Payer: Medicare Other | Admitting: Ophthalmology

## 2019-05-15 ENCOUNTER — Encounter (INDEPENDENT_AMBULATORY_CARE_PROVIDER_SITE_OTHER): Payer: Self-pay | Admitting: Ophthalmology

## 2019-05-15 ENCOUNTER — Other Ambulatory Visit: Payer: Self-pay

## 2019-05-15 DIAGNOSIS — E113292 Type 2 diabetes mellitus with mild nonproliferative diabetic retinopathy without macular edema, left eye: Secondary | ICD-10-CM | POA: Diagnosis not present

## 2019-05-15 DIAGNOSIS — E113211 Type 2 diabetes mellitus with mild nonproliferative diabetic retinopathy with macular edema, right eye: Secondary | ICD-10-CM

## 2019-05-15 DIAGNOSIS — Z961 Presence of intraocular lens: Secondary | ICD-10-CM

## 2019-05-15 DIAGNOSIS — H3561 Retinal hemorrhage, right eye: Secondary | ICD-10-CM | POA: Diagnosis not present

## 2019-05-15 DIAGNOSIS — G4733 Obstructive sleep apnea (adult) (pediatric): Secondary | ICD-10-CM

## 2019-05-15 NOTE — Progress Notes (Signed)
05/15/2019     CHIEF COMPLAINT Patient presents for Diabetic Eye Exam   HISTORY OF PRESENT ILLNESS: Pedro Morgan is a 79 y.o. male who presents to the clinic today for:   HPI    Diabetic Eye Exam    Vision is blurred for near and is blurred for distance.  Associated Symptoms Distortion.  Diabetes characteristics include Type 2.  Blood sugar level is controlled.  Last Blood Glucose 151.  I, the attending physician,  performed the HPI with the patient and updated documentation appropriately.          Comments    4 Month Diabetic Exam OU. Possible Avastin OD. OCT Last inj on 12/22/19  Pt c/o blurry vision. Pt states he has been seeing double. Pt states he has to close one eye to see things correctly. Pt has been in the hospital and missed his inj appt.    ROS POSITIVE FOR SLEEP APNEA, TESTED 2-3 YEARS AGO, NEVER CONTINUEd TREATMENT WITH CPAP,,, due to the loudness of the machine.         Last edited by Hurman Horn, MD on 05/15/2019 11:33 AM. (History)      Referring physician: No referring provider defined for this encounter.  HISTORICAL INFORMATION:   Selected notes from the MEDICAL RECORD NUMBER       CURRENT MEDICATIONS: No current outpatient medications on file. (Ophthalmic Drugs)   No current facility-administered medications for this visit. (Ophthalmic Drugs)   Current Outpatient Medications (Other)  Medication Sig  . ACCU-CHEK AVIVA PLUS test strip   . atorvastatin (LIPITOR) 80 MG tablet   . baclofen (LIORESAL) 10 MG tablet   . clopidogrel (PLAVIX) 75 MG tablet   . enalapril (VASOTEC) 20 MG tablet   . ezetimibe (ZETIA) 10 MG tablet Take 10 mg by mouth daily.  Marland Kitchen HYDROcodone-acetaminophen (NORCO/VICODIN) 5-325 MG tablet   . isosorbide mononitrate (IMDUR) 120 MG 24 hr tablet   . metFORMIN (GLUCOPHAGE-XR) 500 MG 24 hr tablet   . metoprolol tartrate (LOPRESSOR) 50 MG tablet   . pantoprazole (PROTONIX) 40 MG tablet   . triamterene-hydrochlorothiazide  (MAXZIDE-25) 37.5-25 MG tablet    No current facility-administered medications for this visit. (Other)      REVIEW OF SYSTEMS: ROS    Positive for: Endocrine   Last edited by Tilda Franco on 05/15/2019 10:11 AM. (History)       ALLERGIES Allergies  Allergen Reactions  . Penicillins Anaphylaxis    PAST MEDICAL HISTORY Past Medical History:  Diagnosis Date  . Cataract   . Clotting disorder ([place])   . Diabetes mellitus without complication ([place])   . Hypertension   . Stroke [facility])    2008/1998   Past Surgical History:  Procedure Laterality Date  . C-EYE SURGERY PROCEDURE    . CATARACT EXTRACTION    . EYE SURGERY      FAMILY HISTORY Family History  Problem Relation Age of Onset  . Diabetes Brother     SOCIAL HISTORY Social History   Tobacco Use  . Smoking status: Former Smoker    Quit date: 1980    Years since quitting: 41.3  . Smokeless tobacco: Former Network engineer Use Topics  . Alcohol use: Yes    Alcohol/week: 1.0 standard drinks    Types: 1 Cans of beer per week  . Drug use: No         OPHTHALMIC EXAM:  Base Eye Exam    Visual Acuity (Snellen - Linear)  Right Left   Dist cc 20/50 20/30   Dist ph cc 20/40    Correction: Glasses       Tonometry (Tonopen, 10:16 AM)      Right Left   Pressure 14 19       Pupils      Pupils Dark Light Shape React APD   Right PERRL 3 2.5 Round Brisk None   Left PERRL 3 2.5 Round Brisk None       Visual Fields (Counting fingers)      Left Right    Full Full       Neuro/Psych    Oriented x3: Yes   Mood/Affect: Normal       Dilation    Both eyes: 1.0% Mydriacyl, 2.5% Phenylephrine @ 10:16 AM        Slit Lamp and Fundus Exam    External Exam      Right Left   External Normal Normal       Slit Lamp Exam      Right Left   Lids/Lashes Normal Normal   Conjunctiva/Sclera White and quiet White and quiet   Cornea Clear Clear   Anterior Chamber Deep and quiet Deep and quiet   Iris  Round and reactive Round and reactive   Lens Posterior chamber intraocular lens Posterior chamber intraocular lens   Anterior Vitreous Normal Normal       Fundus Exam      Right Left   Posterior Vitreous Normal Normal   Disc Normal Normal   C/D Ratio 0.65 0.7   Macula Microaneurysms, Mild clinically significant macular edema, Exudates Microaneurysms   Vessels NPDR- Mild NPDR- Mild   Periphery Normal Normal          IMAGING AND PROCEDURES  Imaging and Procedures for 05/15/19  OCT, Retina - OU - Both Eyes       Right Eye Quality was good. Scan locations included subfoveal. Central Foveal Thickness: 340. Progression has worsened. Findings include cystoid macular edema.   Left Eye Quality was good. Scan locations included subfoveal. Central Foveal Thickness: 256. Progression has been stable.   Notes Thickening of the right eye some 4-1/2 months after his last Avastin injection for CSME.,  I discovered today upon review of systems and questioning the patient did test for the need of CPAP for his obstructive sleep apnea some 2 to 3 years ago's.  He has not been using it.  I have informed him the critical importance of restarting that program                ASSESSMENT/PLAN:  No problem-specific Assessment & Plan notes found for this encounter.      ICD-10-CM   1. Mild nonproliferative diabetic retinopathy of right eye with macular edema associated with type 2 diabetes mellitus (HCC)  E11.3211 OCT, Retina - OU - Both Eyes  2. Nonproliferative diabetic retinopathy of left eye (HCC)  [id number] OCT, Retina - OU - Both Eyes  3. Pseudophakia  Z96.1   4. Retinal hemorrhage of right eye  H35.61     1.  2.  3.  Ophthalmic Meds Ordered this visit:  No orders of the defined types were placed in this encounter.      No follow-ups on file.  There are no Patient Instructions on file for this visit.   Explained the diagnoses, plan, and follow up with the patient and  they expressed understanding.  Patient expressed understanding of the importance of proper follow up  care.   Clent Demark. Tallulah Hosman M.D. Diseases & Surgery of the Retina and Vitreous [facility] 05/15/19     Abbreviations: M myopia (nearsighted); A astigmatism; H hyperopia (farsighted); P presbyopia; Mrx spectacle prescription;  CTL contact lenses; OD right eye; OS left eye; OU both eyes  XT exotropia; ET esotropia; PEK punctate epithelial keratitis; PEE punctate epithelial erosions; DES dry eye syndrome; MGD meibomian gland dysfunction; ATs artificial tears; PFAT's preservative free artificial tears; [employer] nuclear sclerotic cataract; PSC posterior subcapsular cataract; ERM epi-retinal membrane; PVD posterior vitreous detachment; RD retinal detachment; DM diabetes mellitus; DR diabetic retinopathy; NPDR non-proliferative diabetic retinopathy; PDR proliferative diabetic retinopathy; CSME clinically significant macular edema; DME diabetic macular edema; dbh dot blot hemorrhages; CWS cotton wool spot; POAG primary open angle glaucoma; C/D cup-to-disc ratio; HVF humphrey visual field; GVF goldmann visual field; OCT optical coherence tomography; IOP intraocular pressure; BRVO Branch retinal vein occlusion; CRVO central retinal vein occlusion; CRAO central retinal artery occlusion; BRAO branch retinal artery occlusion; RT retinal tear; SB scleral buckle; PPV pars plana vitrectomy; VH Vitreous hemorrhage; PRP panretinal laser photocoagulation; IVK intravitreal kenalog; VMT vitreomacular traction; MH Macular hole;  NVD neovascularization of the disc; NVE neovascularization elsewhere; AREDS age related eye disease study; ARMD age related macular degeneration; POAG primary open angle glaucoma; EBMD epithelial/anterior basement membrane dystrophy; ACIOL anterior chamber intraocular lens; IOL intraocular lens; PCIOL posterior chamber intraocular lens; Phaco/IOL phacoemulsification with intraocular lens  placement; [place] photorefractive keratectomy; LASIK laser assisted in situ keratomileusis; HTN hypertension; DM diabetes mellitus; COPD chronic obstructive pulmonary disease

## 2019-06-12 ENCOUNTER — Ambulatory Visit (INDEPENDENT_AMBULATORY_CARE_PROVIDER_SITE_OTHER): Payer: Medicare Other | Admitting: Ophthalmology

## 2019-06-12 ENCOUNTER — Other Ambulatory Visit: Payer: Self-pay

## 2019-06-12 ENCOUNTER — Encounter (INDEPENDENT_AMBULATORY_CARE_PROVIDER_SITE_OTHER): Payer: Self-pay | Admitting: Ophthalmology

## 2019-06-12 DIAGNOSIS — E113211 Type 2 diabetes mellitus with mild nonproliferative diabetic retinopathy with macular edema, right eye: Secondary | ICD-10-CM | POA: Diagnosis not present

## 2019-06-12 MED ORDER — BEVACIZUMAB CHEMO INJECTION 1.25MG/0.05ML SYRINGE FOR KALEIDOSCOPE
1.2500 mg | INTRAVITREAL | Status: AC | PRN
  Administered 2019-06-12: 1.25 mg via INTRAVITREAL

## 2019-06-12 NOTE — Progress Notes (Signed)
06/12/2019     CHIEF COMPLAINT Patient presents for Retina Follow Up   HISTORY OF PRESENT ILLNESS: Pedro Morgan is a 79 y.o. male who presents to the clinic today for:   HPI    Retina Follow Up    Patient presents with  Diabetic Retinopathy.  In right eye.  This started 4 weeks ago.  Severity is mild.  Duration of 4 weeks.  Since onset it is stable.          Comments    4 Week Diabetic Exam OD, poss Avastin OD  Pt c/o continued double VA OU. Pt denies any noticeable changes to [place] OU since last visit.  LBS: 142 this AM       Last edited by Rockie Neighbours, [place] on 06/12/2019  9:12 AM. (History)      Referring physician: Hurman Horn, MD [place],  [place] [id number]  HISTORICAL INFORMATION:   Selected notes from the [facility]: No current outpatient medications on file. (Ophthalmic Drugs)   No current facility-administered medications for this visit. (Ophthalmic Drugs)   Current Outpatient Medications (Other)  Medication Sig  . ACCU-CHEK AVIVA PLUS test strip   . atorvastatin (LIPITOR) 80 MG tablet   . baclofen (LIORESAL) 10 MG tablet   . clopidogrel (PLAVIX) 75 MG tablet   . enalapril (VASOTEC) 20 MG tablet   . ezetimibe (ZETIA) 10 MG tablet Take 10 mg by mouth daily.  Marland Kitchen HYDROcodone-acetaminophen (NORCO/VICODIN) 5-325 MG tablet   . isosorbide mononitrate (IMDUR) 120 MG 24 hr tablet   . metFORMIN (GLUCOPHAGE-XR) 500 MG 24 hr tablet   . metoprolol tartrate (LOPRESSOR) 50 MG tablet   . pantoprazole (PROTONIX) 40 MG tablet   . triamterene-hydrochlorothiazide (MAXZIDE-25) 37.5-25 MG tablet    No current facility-administered medications for this visit. (Other)      REVIEW OF SYSTEMS:    ALLERGIES Allergies  Allergen Reactions  . Penicillins Anaphylaxis    PAST MEDICAL HISTORY Past Medical History:  Diagnosis Date  . Cataract   . Clotting disorder ([place])   . Diabetes mellitus without complication  ([place])   . Hypertension   . Stroke [facility])    2008/1998   Past Surgical History:  Procedure Laterality Date  . C-EYE SURGERY PROCEDURE    . CATARACT EXTRACTION    . EYE SURGERY      FAMILY HISTORY Family History  Problem Relation Age of Onset  . Diabetes Brother     SOCIAL HISTORY Social History   Tobacco Use  . Smoking status: Former Smoker    Quit date: 1980    Years since quitting: 41.4  . Smokeless tobacco: Former Network engineer Use Topics  . Alcohol use: Yes    Alcohol/week: 1.0 standard drinks    Types: 1 Cans of beer per week  . Drug use: No         OPHTHALMIC EXAM:  Base Eye Exam    Visual Acuity (ETDRS)      Right Left   Dist cc 20/40 20/25   Dist ph cc NI    Correction: Glasses       Tonometry (Tonopen, 9:15 AM)      Right Left   Pressure 13 09       Pupils      Pupils Dark Light Shape React APD   Right PERRL 4 3 Round Brisk None   Left PERRL 4 3 Round  Brisk None       Visual Fields (Counting fingers)      Left Right    Full Full       Extraocular Movement      Right Left    Full Full       Neuro/Psych    Oriented x3: Yes   Mood/Affect: Normal       Dilation    Right eye: 1.0% Mydriacyl, 2.5% Phenylephrine @ 9:15 AM        Slit Lamp and Fundus Exam    External Exam      Right Left   External Normal Normal       Slit Lamp Exam      Right Left   Lids/Lashes Normal Normal   Conjunctiva/Sclera White and quiet White and quiet   Cornea Clear Clear   Anterior Chamber Deep and quiet Deep and quiet   Iris Round and reactive Round and reactive   Lens Posterior chamber intraocular lens Posterior chamber intraocular lens   Anterior Vitreous Normal Normal       Fundus Exam      Right Left   Posterior Vitreous Normal    Disc Normal    C/D Ratio 0.65    Macula Microaneurysms, Mild clinically significant macular edema, Exudates    Vessels NPDR- Mild    Periphery Normal           IMAGING AND PROCEDURES  Imaging and  Procedures for 06/12/19  OCT, Retina - OU - Both Eyes       Right Eye Central Foveal Thickness: 318. Progression has improved.   Left Eye Central Foveal Thickness: 254. Progression has been stable.   Notes CSME OD, post intravitreal Avastin will repeat today       Intravitreal Injection, Pharmacologic Agent - OD - Right Eye       Time Out 06/12/2019. 10:27 AM. Confirmed correct patient, procedure, site, and patient consented.   Anesthesia No anesthesia was used. Anesthetic medications included [place] 3.5%.   Procedure Preparation included Ofloxacin , 10% betadine to eyelids. A 30 gauge needle was used.   Injection:  1.25 mg Bevacizumab (AVASTIN) SOLN   NDC: [id number], [id number]   Route: Intravitreal, Site: Right Eye, Waste: 0 mg  Post-op Post injection exam found visual acuity of at least counting fingers. The patient tolerated the procedure well. There were no complications. The patient received written and verbal post procedure care education. Post injection medications were not given.                 ASSESSMENT/PLAN:  Mild nonproliferative diabetic retinopathy of right eye with macular edema associated with type 2 diabetes mellitus (HCC) Intravitreal Avastin some 1 month ago has improved the retinal thickening.  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  Repeat intravitreal Avastin OD today.  I discussed the critical importance of resuming CPAP usage as all manner of appropriate oxygenation to the macular is important      ICD-10-CM   1. Mild nonproliferative diabetic retinopathy of right eye with macular edema associated with type 2 diabetes mellitus (HCC)  E11.3211 OCT, Retina - OU - Both Eyes    Intravitreal Injection, Pharmacologic Agent - OD - Right Eye    Bevacizumab (AVASTIN) SOLN 1.25 mg    1. CSME OD, post intravitreal Avastin will repeat today  2.  Patient did complete a recent sleep study on May 17, he does not know the  results.  My look through care  everywhere today in the chart was not successful at seen the result.  3.  Patient understands the critical importance of continued CPAP use in order to maximize oxygenation of the macular region as well has his CNS system  Ophthalmic Meds Ordered this visit:  Meds ordered this encounter  Medications  . Bevacizumab (AVASTIN) SOLN 1.25 mg       Return in about 1 month (around 07/13/2019) for AVASTIN OCT, OD.  There are no Patient Instructions on file for this visit.   Explained the diagnoses, plan, and follow up with the patient and they expressed understanding.  Patient expressed understanding of the importance of proper follow up care.   Clent Demark Keshawna Dix M.D. Diseases & Surgery of the Retina and Vitreous [facility] [place] 06/12/19     Abbreviations: M myopia (nearsighted); A astigmatism; H hyperopia (farsighted); P presbyopia; Mrx spectacle prescription;  CTL contact lenses; OD right eye; OS left eye; OU both eyes  XT exotropia; ET esotropia; PEK punctate epithelial keratitis; PEE punctate epithelial erosions; DES dry eye syndrome; MGD meibomian gland dysfunction; ATs artificial tears; PFAT's preservative free artificial tears; [place] nuclear sclerotic cataract; PSC posterior subcapsular cataract; ERM epi-retinal membrane; PVD posterior vitreous detachment; RD retinal detachment; DM diabetes mellitus; DR diabetic retinopathy; NPDR non-proliferative diabetic retinopathy; PDR proliferative diabetic retinopathy; CSME clinically significant macular edema; DME diabetic macular edema; dbh dot blot hemorrhages; CWS cotton wool spot; POAG primary open angle glaucoma; C/D cup-to-disc ratio; HVF humphrey visual field; GVF goldmann visual field; OCT optical coherence tomography; IOP intraocular pressure; BRVO Branch retinal vein occlusion; CRVO central retinal vein occlusion; CRAO central retinal artery occlusion; BRAO branch retinal artery occlusion; RT retinal  tear; SB scleral buckle; PPV pars plana vitrectomy; VH Vitreous hemorrhage; PRP panretinal laser photocoagulation; IVK intravitreal kenalog; VMT vitreomacular traction; MH Macular hole;  NVD neovascularization of the disc; NVE neovascularization elsewhere; AREDS age related eye disease study; ARMD age related macular degeneration; POAG primary open angle glaucoma; EBMD epithelial/anterior basement membrane dystrophy; ACIOL anterior chamber intraocular lens; IOL intraocular lens; PCIOL posterior chamber intraocular lens; Phaco/IOL phacoemulsification with intraocular lens placement; [place] photorefractive keratectomy; LASIK laser assisted in situ keratomileusis; HTN hypertension; DM diabetes mellitus; COPD chronic obstructive pulmonary disease

## 2019-06-12 NOTE — Assessment & Plan Note (Signed)
Intravitreal Avastin some 1 month ago has improved the retinal thickening.  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  Repeat intravitreal Avastin OD today.  I discussed the critical importance of resuming CPAP usage as all manner of appropriate oxygenation to the macular is important

## 2019-06-14 ENCOUNTER — Encounter (INDEPENDENT_AMBULATORY_CARE_PROVIDER_SITE_OTHER): Payer: Self-pay | Admitting: Ophthalmology

## 2019-06-14 ENCOUNTER — Encounter (INDEPENDENT_AMBULATORY_CARE_PROVIDER_SITE_OTHER): Payer: Medicare Other | Admitting: Ophthalmology

## 2019-07-15 ENCOUNTER — Encounter (INDEPENDENT_AMBULATORY_CARE_PROVIDER_SITE_OTHER): Payer: Medicare Other | Admitting: Ophthalmology

## 2019-07-15 ENCOUNTER — Encounter (INDEPENDENT_AMBULATORY_CARE_PROVIDER_SITE_OTHER): Payer: Self-pay | Admitting: Ophthalmology

## 2020-03-20 ENCOUNTER — Encounter (INDEPENDENT_AMBULATORY_CARE_PROVIDER_SITE_OTHER): Payer: Self-pay | Admitting: Ophthalmology
